# Patient Record
Sex: Male | Born: 1980 | Race: White | Hispanic: No | Marital: Married | Smoking: Never smoker
Health system: Southern US, Community
[De-identification: ages and names within clinical notes are randomized; demographics above are authoritative.]

## PROBLEM LIST (undated history)

## (undated) DIAGNOSIS — C6290 Malignant neoplasm of unspecified testis, unspecified whether descended or undescended: Secondary | ICD-10-CM

## (undated) HISTORY — PX: TESTICLE REMOVAL: SHX68

---
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
## 2011-11-11 ENCOUNTER — Emergency Department (HOSPITAL_COMMUNITY): Payer: Worker's Compensation

## 2011-11-11 ENCOUNTER — Emergency Department (HOSPITAL_COMMUNITY)
Admission: EM | Admit: 2011-11-11 | Discharge: 2011-11-11 | Disposition: A | Discharge status: Home Health | Payer: Worker's Compensation | Attending: Emergency Medicine | Admitting: Emergency Medicine

## 2011-11-11 ENCOUNTER — Encounter (HOSPITAL_COMMUNITY): Payer: Self-pay

## 2011-11-11 DIAGNOSIS — Y939 Activity, unspecified: Secondary | ICD-10-CM | POA: Insufficient documentation

## 2011-11-11 DIAGNOSIS — S0993XA Unspecified injury of face, initial encounter: Secondary | ICD-10-CM | POA: Insufficient documentation

## 2011-11-11 DIAGNOSIS — M542 Cervicalgia: Secondary | ICD-10-CM

## 2011-11-11 DIAGNOSIS — W208XXA Other cause of strike by thrown, projected or falling object, initial encounter: Secondary | ICD-10-CM | POA: Insufficient documentation

## 2011-11-11 DIAGNOSIS — S0990XA Unspecified injury of head, initial encounter: Secondary | ICD-10-CM | POA: Insufficient documentation

## 2011-11-11 DIAGNOSIS — Y929 Unspecified place or not applicable: Secondary | ICD-10-CM | POA: Insufficient documentation

## 2011-11-11 MED ORDER — MORPHINE SULFATE 2 MG/ML IJ SOLN
INTRAMUSCULAR | Status: DC | PRN
  Administered 2011-11-11: 2 mg via INTRAVENOUS

## 2011-11-11 MED ORDER — OXYCODONE-ACETAMINOPHEN 5-325 MG PO TABS: 2.0000 | ORAL_TABLET | ORAL | Status: DC | PRN

## 2011-11-11 MED ORDER — MORPHINE SULFATE 2 MG/ML IJ SOLN
INTRAMUSCULAR | Status: AC
  Filled 2011-11-11: qty 1
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
## 2011-11-11 NOTE — ED Notes (Signed)
Paper scrubs given to pt 

## 2011-11-11 NOTE — ED Notes (Signed)
Chaplain is speaking with patient and EMS driver sts he will call wife for patient.
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
## 2013-03-03 IMAGING — CT CT HEAD W/O CM
3 of 5 series · 16 of 47 positions shown, 19 images · non-contrast
Comparison: None

CT HEAD

CLINICAL DATA: Neck pain.  A roof collapsed on his head.

CT HEAD WITHOUT CONTRAST
CT CERVICAL SPINE WITHOUT CONTRAST
TECHNIQUE: Multidetector CT imaging of the head and cervical spine
was performed following the standard protocol without intravenous
contrast.  Multiplanar CT image reconstructions of the cervical
spine were also generated.

[Series 602: cor · coronal · 0.40mm/px · 3 of 29 slices shown]
[im 10/29  brain]
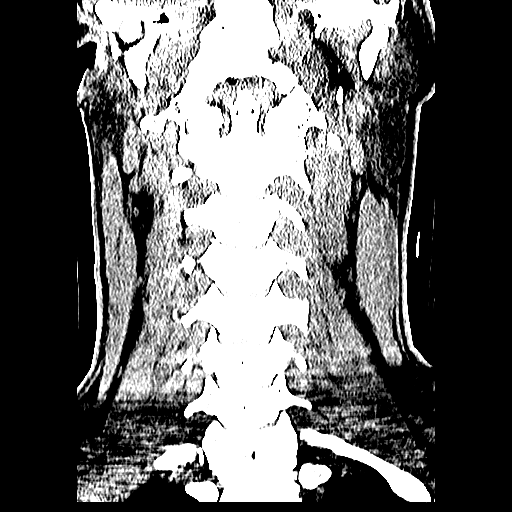
[im 13/29  brain]
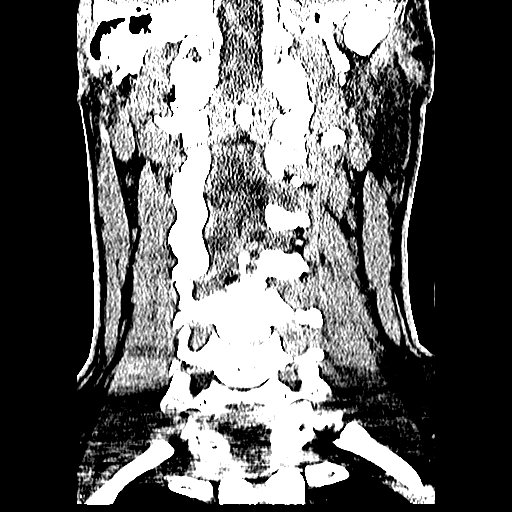
[im 16/29  brain]
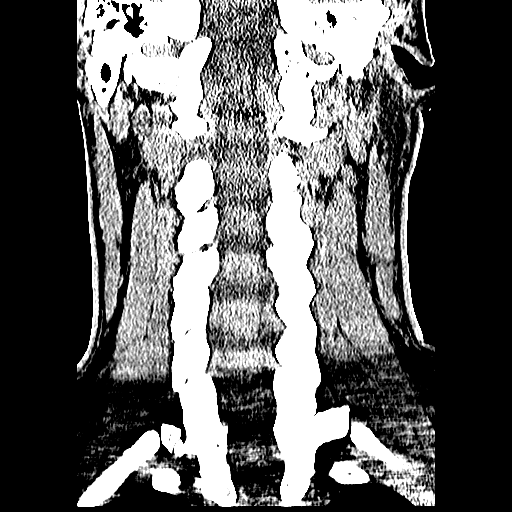

[Series 603: axials · axial · 0.40mm/px · z∈[-329,-156]mm · 10 of 105 slices shown, 13 images]
[im 9/105  brain]
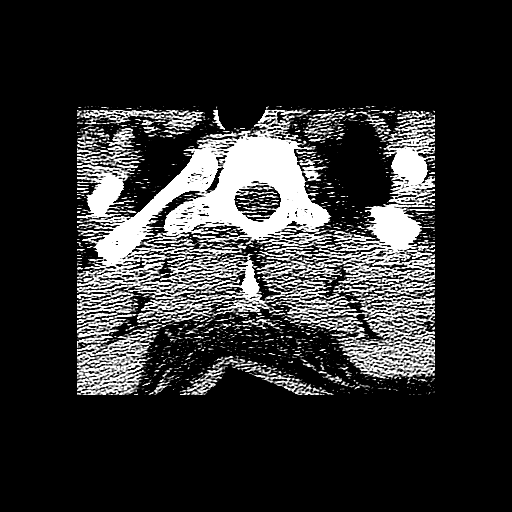
[im 9/105  bone]
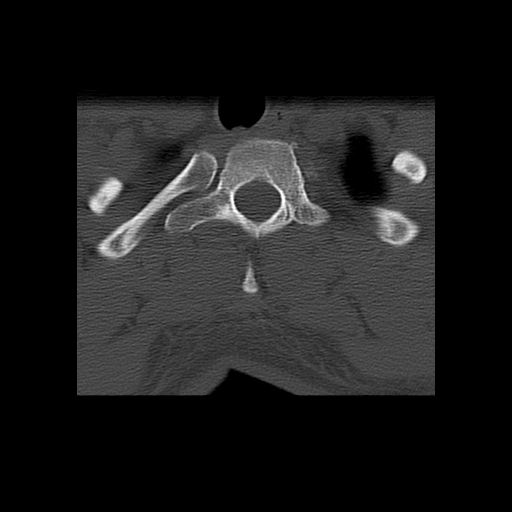
[im 18/105  brain]
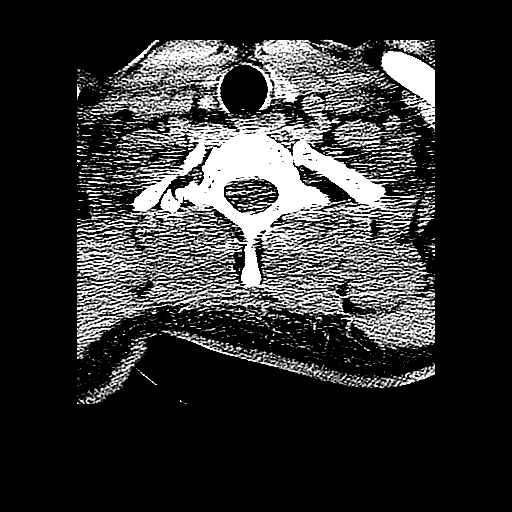
[im 27/105  brain]
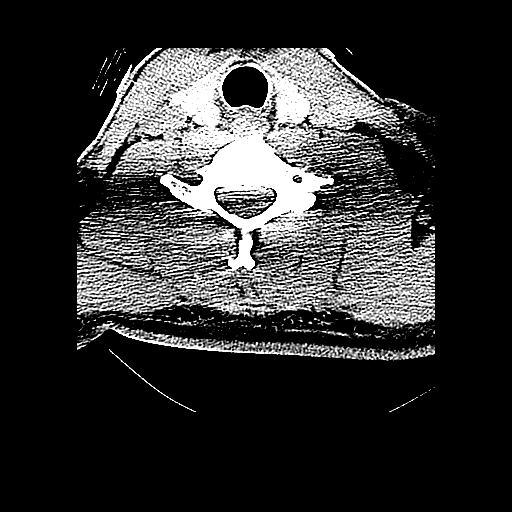
[im 35/105  brain]
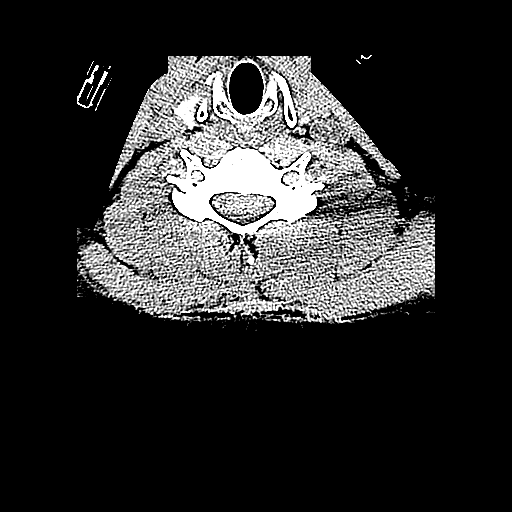
[im 44/105  brain]
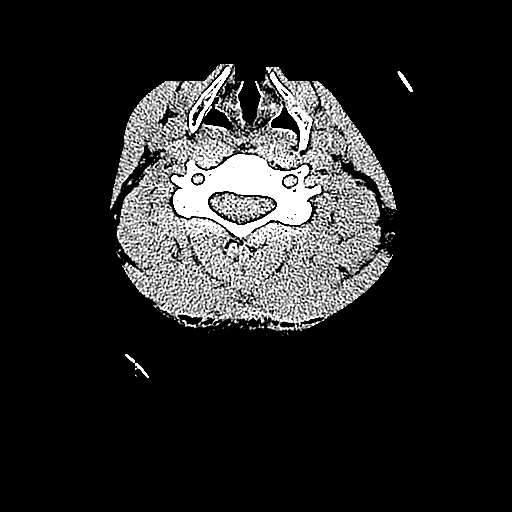
[im 44/105  bone]
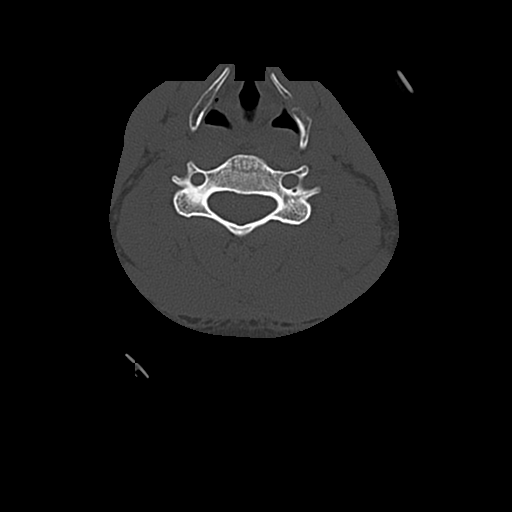
[im 61/105  brain]
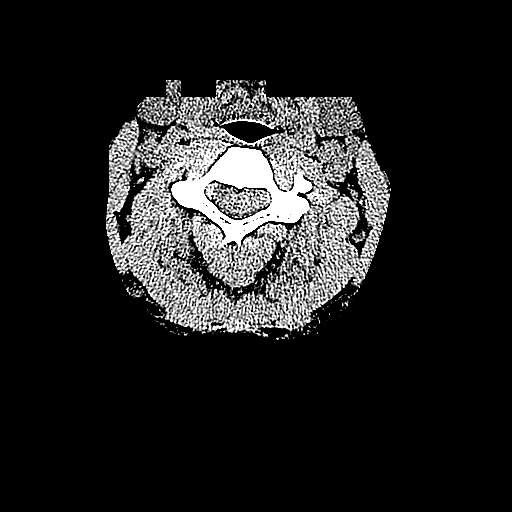
[im 70/105  brain]
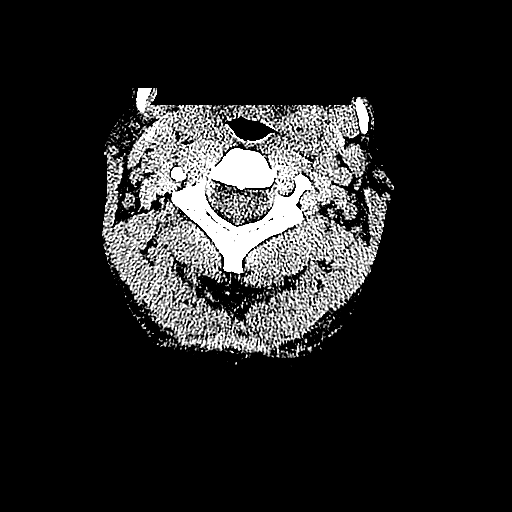
[im 79/105  brain]
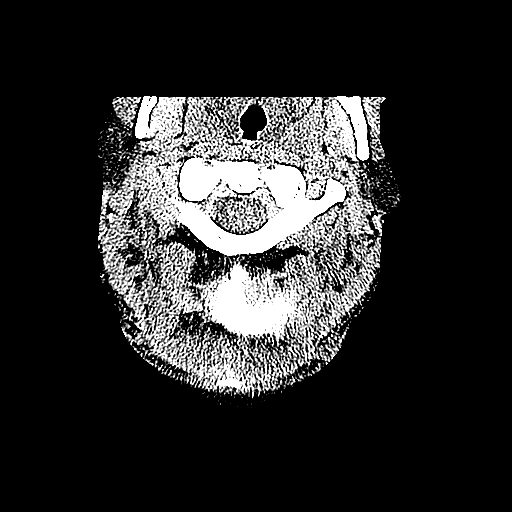
[im 87/105  brain]
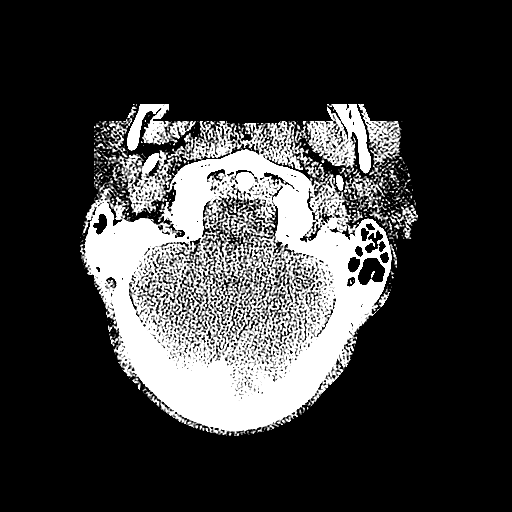
[im 87/105  bone]
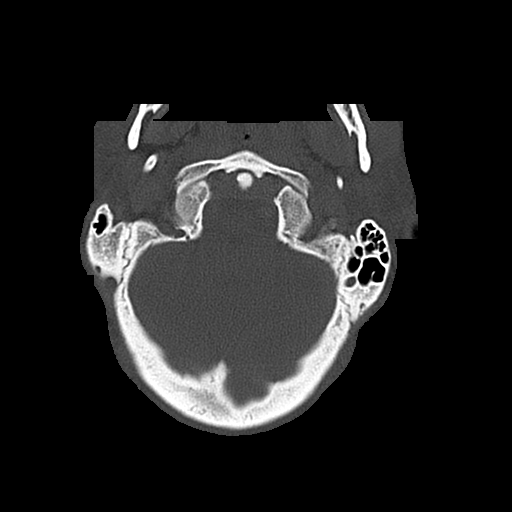
[im 96/105  brain]
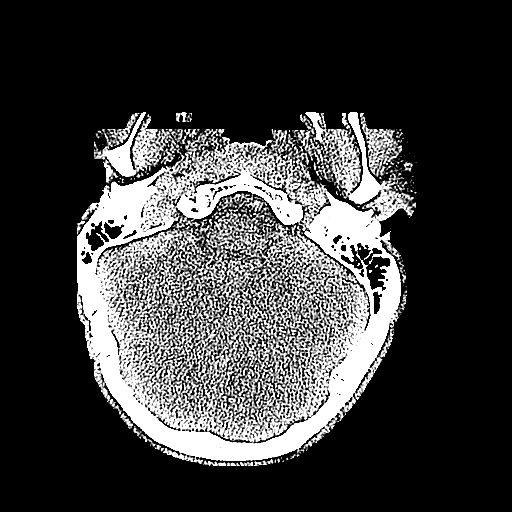

[Series 604: sag · sagittal · 0.40mm/px · 3 of 42 slices shown]
[im 14/42  brain]
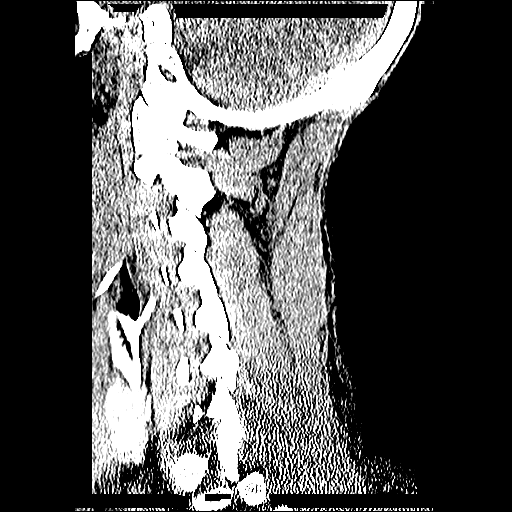
[im 21/42  brain]
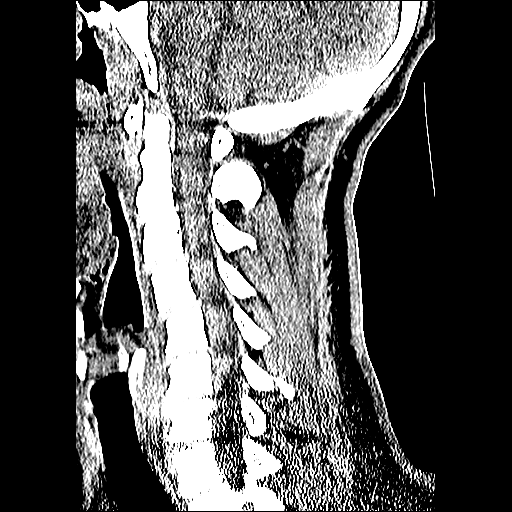
[im 28/42  brain]
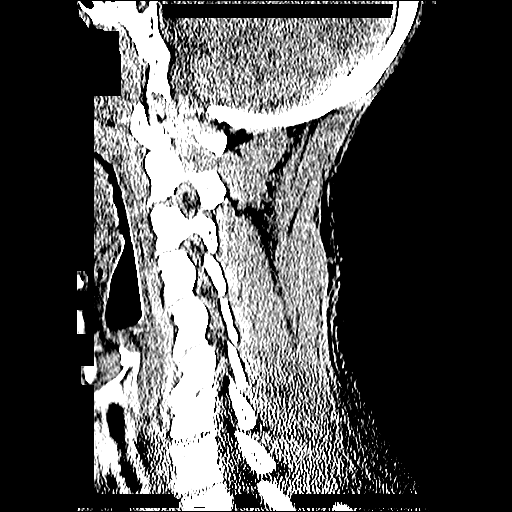

[16 of 47 positions shown; findings below may reference images not displayed]

FINDINGS: No acute intracranial abnormality is present.
Specifically, there is no evidence for acute infarct, hemorrhage,
mass, hydrocephalus, or extra-axial fluid collection.  The
paranasal sinuses and mastoid air cells are clear.  The globes and
orbits are intact.  The osseous skull is intact.
IMPRESSION: Negative CT of the head.

CT CERVICAL SPINE
FINDINGS: The cervical spine is imaged from the skull base through
the midbody of T2.  There is straightening and some focal reversal
of the normal cervical lordosis at C5.  The vertebral body heights
are maintained.  Alignment is anatomic.  The soft tissues are
unremarkable.
IMPRESSION: 1.  No acute fracture or dislocation.
2.  Straightening and slight reversal of the normal cervical
lordosis.  This may be positional as the patient is in a hard
collar.  No malalignment or posterior splaying is evident to
suggest an acute ligamentous injury.

## 2013-10-13 ENCOUNTER — Encounter (HOSPITAL_COMMUNITY): Payer: Self-pay | Admitting: Emergency Medicine

## 2013-10-13 ENCOUNTER — Emergency Department (HOSPITAL_COMMUNITY)
Admission: EM | Admit: 2013-10-13 | Discharge: 2013-10-13 | Disposition: A | Discharge status: Home / Self Care | Payer: Self-pay | Attending: Emergency Medicine | Admitting: Emergency Medicine

## 2013-10-13 DIAGNOSIS — Z880 Allergy status to penicillin: Secondary | ICD-10-CM | POA: Insufficient documentation

## 2013-10-13 DIAGNOSIS — H9209 Otalgia, unspecified ear: Secondary | ICD-10-CM | POA: Insufficient documentation

## 2013-10-13 DIAGNOSIS — Y838 Other surgical procedures as the cause of abnormal reaction of the patient, or of later complication, without mention of misadventure at the time of the procedure: Secondary | ICD-10-CM | POA: Insufficient documentation

## 2013-10-13 DIAGNOSIS — T8140XA Infection following a procedure, unspecified, initial encounter: Secondary | ICD-10-CM | POA: Insufficient documentation

## 2013-10-13 DIAGNOSIS — T8131XA Disruption of external operation (surgical) wound, not elsewhere classified, initial encounter: Secondary | ICD-10-CM

## 2013-10-13 DIAGNOSIS — Z8547 Personal history of malignant neoplasm of testis: Secondary | ICD-10-CM | POA: Insufficient documentation

## 2013-10-13 DIAGNOSIS — H6123 Impacted cerumen, bilateral: Secondary | ICD-10-CM

## 2013-10-13 DIAGNOSIS — H6120 Impacted cerumen, unspecified ear: Secondary | ICD-10-CM | POA: Insufficient documentation

## 2013-10-13 HISTORY — DX: Malignant neoplasm of unspecified testis, unspecified whether descended or undescended: C62.90

## 2013-10-13 MED ORDER — CARBAMIDE PEROXIDE 6.5 % OT SOLN: 5.0000 [drp] | Freq: Two times a day (BID) | OTIC | Status: AC

## 2013-10-13 NOTE — ED Notes (Signed)
Rt ear irrigated with warm water and H202,  Very small particles of wax returned.

## 2013-10-13 NOTE — ED Notes (Signed)
Rt ear pain for 2 weeks.  With decreased hearing. Says he has had similar problem in past and was due to wax.  Has had his ear flushed at home with removal of wax ,but cont discomfort and decreased hearing.

## 2013-10-13 NOTE — ED Provider Notes (Signed)
CSN: [id number]     Arrival date & time 10/13/13  1202 History   First MD Initiated Contact with Patient 10/13/13 1428   This chart was scribed for non-physician practitioner Evalee Jefferson, PA-C,  working with Sharyon Cable, MD by Rosary Lively, ED scribe. This patient was seen in room APFT22/APFT22 and the patient's care was started at 3:07 PM.    Chief Complaint  Patient presents with  . Otalgia   The history is provided by the patient. No language interpreter was used.   HPI Comments:  Paul Fitzpatrick is a 33 y.o. male who presents to the Emergency Department complaining of otalgia, onset 1 week ago. Pt reports that he has attempted to rinse his ear, without relief.  Pt denies the use of Q- tips. Pt reports that he had to be seen at the hospital to have cerumen flushed from his ear in previous episodes. Pt reports that has been on antibiotics for testicular cancer removal that was 2 weeks ago. Pt reports that the surgery was performed by Dr. Marica Otter in [place]. Pt reports that he was taking Keflex, and finished prescription 7 days ago.  Pt reports that the incision opened up yesterday, at approximately 3:00PM and began to drain with the presentation of blood. Pt reports that Dr. Marica Otter is unaware of the incision opening, but he has an appointment scheduled for 10/06. Pt denies fever.  Past Medical History  Diagnosis Date  . Testicular cancer    Past Surgical History  Procedure Laterality Date  . Testicle removal Right    No family history on file. History  Substance Use Topics  . Smoking status: Never Smoker   . Smokeless tobacco: Not on file  . Alcohol Use: No    Review of Systems  Constitutional: Negative for fever.  HENT: Positive for ear pain. Negative for congestion and sore throat.   Eyes: Negative.   Respiratory: Negative for chest tightness and shortness of breath.   Cardiovascular: Negative for chest pain.  Gastrointestinal: Negative for nausea and abdominal pain.   Genitourinary: Negative.   Musculoskeletal: Negative for arthralgias, joint swelling and neck pain.  Skin: Negative for rash and wound.       Open incision  Neurological: Negative for dizziness, weakness, light-headedness, numbness and headaches.  Psychiatric/Behavioral: Negative.     Allergies  Codeine and Penicillins  Home Medications   Prior to Admission medications   Medication Sig Start Date End Date Taking? Authorizing Provider  HYDROcodone-acetaminophen (NORCO) 10-325 MG per tablet Take 1 tablet by mouth every 6 (six) hours as needed.   Yes Historical Provider, MD  carbamide peroxide (DEBROX) 6.5 % otic solution Place 5 drops into both ears 2 (two) times daily. 10/13/13   Evalee Jefferson, PA-C   BP 131/88  Pulse 79  Temp(Src) 97.9 F (36.6 C) (Oral)  Resp 17  Ht 6\' 1"  (1.854 m)  Wt 197 lb (89.359 kg)  BMI 26.00 kg/m2  SpO2 99% Physical Exam  Nursing note and vitals reviewed. Constitutional: He appears well-developed and well-nourished.  HENT:  Head: Normocephalic and atraumatic.  Right cerumen impaction. No pain with retraction. No drainage. No mastoid tenderness.   Eyes: Conjunctivae are normal.  Neck: Normal range of motion.  Cardiovascular: Normal rate, regular rhythm, normal heart sounds and intact distal pulses.   Pulmonary/Chest: Effort normal and breath sounds normal. He has no wheezes.  Abdominal: Soft. Bowel sounds are normal. There is no tenderness.  Genitourinary:  Pt has a small dehisced surgical  incision in his right groin, with a trace of bloody drainage.  Musculoskeletal: Normal range of motion.  Neurological: He is alert.  Skin: Skin is warm and dry.  Psychiatric: He has a normal mood and affect.    ED Course  EAR CERUMEN REMOVAL Date/Time: 10/13/2013 4:10 PM Performed by: Evalee Jefferson Authorized by: Evalee Jefferson Consent: Verbal consent obtained. Risks and benefits: risks, benefits and alternatives were discussed Consent given by: patient Time  out: Immediately prior to procedure a "time out" was called to verify the correct patient, procedure, equipment, support staff and site/side marked as required. Ceruminolytics applied: Ceruminolytics applied prior to the procedure. Location details: right ear Procedure type: irrigation Patient tolerance: Patient tolerated the procedure well with no immediate complications. Comments: Small amounts of cerumen removed, but still with impaction after multiple flushes   DIAGNOSTIC STUDIES: Oxygen Saturation is 99% on RA, normal by my interpretation.  COORDINATION OF CARE:   Labs Review Labs Reviewed - No data to display  Imaging Review No results found.   EKG Interpretation None      MDM   Final diagnoses:  Cerumen impaction, bilateral  Dehiscence of incision, initial encounter    Pt also seen by Dr. Christy Gentles for eval of dehisced wound.  Plan to f/u with his surgeon as planned.  No evidence of infection at the incision site, appears stable, granulation tissue present, no revision required today.  Referred to Dr. Benjamine Mola for further evaluation of cerumen impaction.  He was prescribed debrox for tx in anticipation of repeat irrigation/removal procedure by specialist.  The patient appears reasonably screened and/or stabilized for discharge and I doubt any other medical condition or other [facility] requiring further screening, evaluation, or treatment in the ED at this time prior to discharge.   I personally performed the services described in this documentation, which was scribed in my presence. The recorded information has been reviewed and is accurate.   Evalee Jefferson, PA-C 10/22/13 1238

## 2013-10-13 NOTE — ED Provider Notes (Signed)
Patient seen/examined in the Emergency Department in conjunction with Midlevel Provider Idol Patient reports ear pain and wound dehischence from recent testicular surgery Exam : awake/alert, no distress. Family at bedside for exam. He has mild dehiscence of wound just above right inguinal canal.  No active bleeding.  No drainage Plan: he will need to call urologist for followup tomorrow   Sharyon Cable, MD 10/13/13 [phone]

## 2013-10-13 NOTE — ED Notes (Signed)
C/o rt ear pain ? Ear infection per pt.

## 2013-10-13 NOTE — Discharge Instructions (Signed)
Cerumen Impaction A cerumen impaction is when the wax in your ear forms a plug. This plug usually causes reduced hearing. Sometimes it also causes an earache or dizziness. Removing a cerumen impaction can be difficult and painful. The wax sticks to the ear canal. The canal is sensitive and bleeds easily. If you try to remove a heavy wax buildup with a cotton tipped swab, you may push it in further. Irrigation with water, suction, and small ear curettes may be used to clear out the wax. If the impaction is fixed to the skin in the ear canal, ear drops may be needed for a few days to loosen the wax. People who build up a lot of wax frequently can use ear wax removal products available in your local drugstore. SEEK MEDICAL CARE IF:  You develop an earache, increased hearing loss, or marked dizziness. Document Released: 02/10/2004 Document Revised: 03/27/2011 Document Reviewed: 04/01/2009 [facility] Patient Information 2015 [place], [place]. This information is not intended to replace advice given to you by your health care provider. Make sure you discuss any questions you have with your health care provider.  Wound Dehiscence Wound dehiscence is when a surgical cut (incision) breaks open and does not heal properly after surgery. It usually happens 7-10 days after surgery. This can be a serious condition. It is important to identify and treat this condition early.  CAUSES  Some common causes of wound dehiscence include:  Stretching of the wound area. This may be caused by lifting, vomiting, violent coughing, or straining during bowel movements.  Wound infection.  Early stitch (suture) removal. RISK FACTORS Various things can increase your risk of developing wound dehiscence, including:  Obesity.  Lung disease.  Smoking.  Poor nutrition.  Contamination during surgery. SIGNS AND SYMPTOMS  Bleeding from the wound.  Pain.  Fever.  Wound starts breaking open. DIAGNOSIS  Your health care  provider may diagnose wound dehiscence by monitoring the incision and noting any changes in the wound. These changes can include an increase in drainage or pain. The health care provider may also ask you if you have noticed any stretching or tearing of the wound.  Wound cultures may be taken to determine if there is an infection.  Imaging studies, such as an MRI scan or CT scan, may be done to determine if there is a collection of pus or fluid in the wound area. TREATMENT Treatment may include:  Wound care.  Surgical repair.  Antibiotic medicine to treat or prevent infection.  Medicines to reduce pain and swelling. HOME CARE INSTRUCTIONS   Only take over-the-counter or prescription medicines for pain, discomfort, or fever as directed by your health care provider. Taking pain medicine 30 minutes before changing a bandage (dressing) can help relieve pain.  Take your antibiotics as directed. Finish them even if you start to feel better.  Gently wash the area with mild soap and water 2 times a day, or as directed. Rinse off the soap. Pat the area dry with a clean towel. Do not rub the wound. This may cause bleeding.  Follow your health care provider's instructions for how often you need to change the dressing and packing inside. Wash your hands well before and after changing your dressing. Apply a dressing to the wound as directed.  Take showers. Do not soak the wound, bathe, swim, or use a hot tub until directed by your health care provider.  Avoid exercises that make you sweat heavily.  Use anti-itch medicine as directed by your  health care provider. The wound may itch when it is healing. Do not pick or scratch at the wound.  Do not lift more than 10 pounds (4.5 kg) until the wound is healed, or as directed by your health care provider.  Keep all follow-up appointments as directed. SEEK MEDICAL CARE IF:  You have excessive bleeding from your surgical wound.  Your wound does not  seem to be healing properly.  You have a fever. SEEK IMMEDIATE MEDICAL CARE IF:   You have increased swelling or redness around the wound.  You have increasing pain in the wound.  You have an increasing amount of pus coming from the wound.  Your wound breaks open farther. MAKE SURE YOU:   Understand these instructions.  Will watch your condition.  Will get help right away if you are not doing well or get worse. Document Released: 03/25/2003 Document Revised: 01/07/2013 Document Reviewed: 09/09/2012 [facility] Patient Information 2015 [place], [place]. This information is not intended to replace advice given to you by your health care provider. Make sure you discuss any questions you have with your health care provider.  Apply 5 drops of the Debrox solution into both the ears twice daily.  After 4-5 days of this treatment attempt further flushing as this may elimimate the cerumen impaction.  Call Dr. Exie Parody for a recheck of your incisional wound.  Call Dr. Benjamine Mola as referred above or return here for any continued problems with your ears.

## 2013-10-22 NOTE — ED Provider Notes (Signed)
Medical screening examination/treatment/procedure(s) were conducted as a shared visit with non-physician practitioner(s) and myself.  I personally evaluated the patient during the encounter.   EKG Interpretation None        Sharyon Cable, MD 10/22/13 1421

## 2018-12-09 ENCOUNTER — Ambulatory Visit (INDEPENDENT_AMBULATORY_CARE_PROVIDER_SITE_OTHER): Payer: Self-pay | Admitting: Otolaryngology
